# Patient Record
Sex: Female | Born: 1975 | Race: White | Hispanic: Yes | Marital: Married | Smoking: Never smoker
Health system: Southern US, Community
[De-identification: ages and names within clinical notes are randomized; demographics above are authoritative.]

## PROBLEM LIST (undated history)

## (undated) DIAGNOSIS — E785 Hyperlipidemia, unspecified: Secondary | ICD-10-CM

## (undated) DIAGNOSIS — R011 Cardiac murmur, unspecified: Secondary | ICD-10-CM

## (undated) DIAGNOSIS — D649 Anemia, unspecified: Secondary | ICD-10-CM

## (undated) HISTORY — DX: Hyperlipidemia, unspecified: E78.5

## (undated) HISTORY — DX: Cardiac murmur, unspecified: R01.1

## (undated) HISTORY — DX: Anemia, unspecified: D64.9

---

## 2006-07-06 ENCOUNTER — Encounter (INDEPENDENT_AMBULATORY_CARE_PROVIDER_SITE_OTHER): Payer: Self-pay | Admitting: *Deleted

## 2006-07-06 ENCOUNTER — Ambulatory Visit: Payer: Self-pay | Admitting: Family Medicine

## 2006-07-07 LAB — CONVERTED CEMR LAB
Basophils Absolute: 0 10*3/uL (ref 0.0–0.1)
Basophils Relative: 0 % (ref 0–1)
Eosinophils Absolute: 0.1 10*3/uL (ref 0.0–0.7)
MCHC: 32 g/dL (ref 30.0–36.0)
MCV: 94.1 fL (ref 78.0–100.0)
Neutro Abs: 8.8 10*3/uL — ABNORMAL HIGH (ref 1.7–7.7)
Neutrophils Relative %: 71 % (ref 43–77)
RDW: 14 % (ref 11.5–14.0)
Rubella: 69 intl units/mL — ABNORMAL HIGH

## 2006-07-13 ENCOUNTER — Ambulatory Visit: Payer: Self-pay | Admitting: Family Medicine

## 2006-07-13 ENCOUNTER — Encounter (INDEPENDENT_AMBULATORY_CARE_PROVIDER_SITE_OTHER): Payer: Self-pay | Admitting: *Deleted

## 2006-07-14 ENCOUNTER — Ambulatory Visit: Payer: Self-pay | Admitting: Sports Medicine

## 2006-07-16 LAB — CONVERTED CEMR LAB: Pap Smear: NORMAL

## 2006-07-17 ENCOUNTER — Ambulatory Visit: Payer: Self-pay | Admitting: Sports Medicine

## 2006-07-31 LAB — CONVERTED CEMR LAB: GC Probe Amp, Genital: NEGATIVE

## 2006-08-16 ENCOUNTER — Ambulatory Visit: Payer: Self-pay | Admitting: Family Medicine

## 2006-08-17 ENCOUNTER — Ambulatory Visit: Payer: Self-pay | Admitting: Family Medicine

## 2006-08-17 ENCOUNTER — Encounter (INDEPENDENT_AMBULATORY_CARE_PROVIDER_SITE_OTHER): Payer: Self-pay | Admitting: *Deleted

## 2006-09-06 ENCOUNTER — Ambulatory Visit (HOSPITAL_COMMUNITY): Admission: RE | Admit: 2006-09-06 | Discharge: 2006-09-06 | Payer: Self-pay | Admitting: Sports Medicine

## 2006-09-15 ENCOUNTER — Ambulatory Visit: Payer: Self-pay | Admitting: Family Medicine

## 2006-10-02 ENCOUNTER — Encounter (INDEPENDENT_AMBULATORY_CARE_PROVIDER_SITE_OTHER): Payer: Self-pay | Admitting: *Deleted

## 2006-10-11 ENCOUNTER — Encounter (INDEPENDENT_AMBULATORY_CARE_PROVIDER_SITE_OTHER): Payer: Self-pay | Admitting: *Deleted

## 2006-10-17 ENCOUNTER — Ambulatory Visit: Payer: Self-pay | Admitting: Family Medicine

## 2006-10-17 LAB — CONVERTED CEMR LAB: GTT, 1 hr: 185 mg/dL

## 2006-10-18 ENCOUNTER — Encounter (INDEPENDENT_AMBULATORY_CARE_PROVIDER_SITE_OTHER): Payer: Self-pay | Admitting: *Deleted

## 2006-10-18 ENCOUNTER — Ambulatory Visit: Payer: Self-pay | Admitting: Family Medicine

## 2006-11-16 ENCOUNTER — Ambulatory Visit: Payer: Self-pay | Admitting: Family Medicine

## 2006-11-16 ENCOUNTER — Encounter (INDEPENDENT_AMBULATORY_CARE_PROVIDER_SITE_OTHER): Payer: Self-pay | Admitting: *Deleted

## 2006-11-16 LAB — CONVERTED CEMR LAB

## 2006-11-30 ENCOUNTER — Ambulatory Visit: Payer: Self-pay | Admitting: Family Medicine

## 2006-12-11 ENCOUNTER — Ambulatory Visit: Payer: Self-pay | Admitting: Family Medicine

## 2006-12-11 LAB — CONVERTED CEMR LAB: Whiff Test: NEGATIVE

## 2006-12-27 ENCOUNTER — Ambulatory Visit: Payer: Self-pay | Admitting: Family Medicine

## 2007-01-10 ENCOUNTER — Ambulatory Visit: Payer: Self-pay | Admitting: Family Medicine

## 2007-01-10 ENCOUNTER — Encounter: Payer: Self-pay | Admitting: Family Medicine

## 2007-01-10 LAB — CONVERTED CEMR LAB: GC Probe Amp, Genital: NEGATIVE

## 2007-01-11 ENCOUNTER — Encounter: Payer: Self-pay | Admitting: Family Medicine

## 2007-01-17 ENCOUNTER — Ambulatory Visit: Payer: Self-pay | Admitting: Family Medicine

## 2007-01-22 ENCOUNTER — Telehealth (INDEPENDENT_AMBULATORY_CARE_PROVIDER_SITE_OTHER): Payer: Self-pay | Admitting: Family Medicine

## 2007-01-22 ENCOUNTER — Ambulatory Visit: Payer: Self-pay | Admitting: Obstetrics & Gynecology

## 2007-01-24 ENCOUNTER — Ambulatory Visit: Payer: Self-pay | Admitting: Family Medicine

## 2007-01-30 ENCOUNTER — Ambulatory Visit: Payer: Self-pay | Admitting: Family Medicine

## 2007-02-05 ENCOUNTER — Ambulatory Visit: Payer: Self-pay | Admitting: Family Medicine

## 2007-02-07 ENCOUNTER — Ambulatory Visit: Payer: Self-pay | Admitting: *Deleted

## 2007-02-07 ENCOUNTER — Inpatient Hospital Stay (HOSPITAL_COMMUNITY): Admission: AD | Admit: 2007-02-07 | Discharge: 2007-02-07 | Payer: Self-pay | Admitting: Obstetrics and Gynecology

## 2007-02-10 ENCOUNTER — Ambulatory Visit: Payer: Self-pay | Admitting: Obstetrics and Gynecology

## 2007-02-10 ENCOUNTER — Inpatient Hospital Stay (HOSPITAL_COMMUNITY): Admission: AD | Admit: 2007-02-10 | Discharge: 2007-02-14 | Payer: Self-pay | Admitting: Gynecology

## 2007-03-15 ENCOUNTER — Ambulatory Visit: Payer: Self-pay | Admitting: Family Medicine

## 2007-03-15 ENCOUNTER — Encounter: Payer: Self-pay | Admitting: Family Medicine

## 2007-06-05 ENCOUNTER — Ambulatory Visit: Payer: Self-pay | Admitting: Family Medicine

## 2007-06-20 ENCOUNTER — Ambulatory Visit: Payer: Self-pay | Admitting: Family Medicine

## 2007-08-18 ENCOUNTER — Encounter: Payer: Self-pay | Admitting: *Deleted

## 2007-09-20 IMAGING — US US OB COMP +14 WK
1 series · 14 of 28 positions shown · non-contrast
Comparison: none

OBSTETRICAL ULTRASOUND:

 This ultrasound exam was performed in the [HOSPITAL] Ultrasound Department.  The OB US report was generated in the AS system, and faxed to the ordering physician.  This report is also available in [REDACTED] PACS.

[Series 1: us ob comp +14 wk · 0.28mm/px · 14 of 69 slices shown]
[im 3/69]
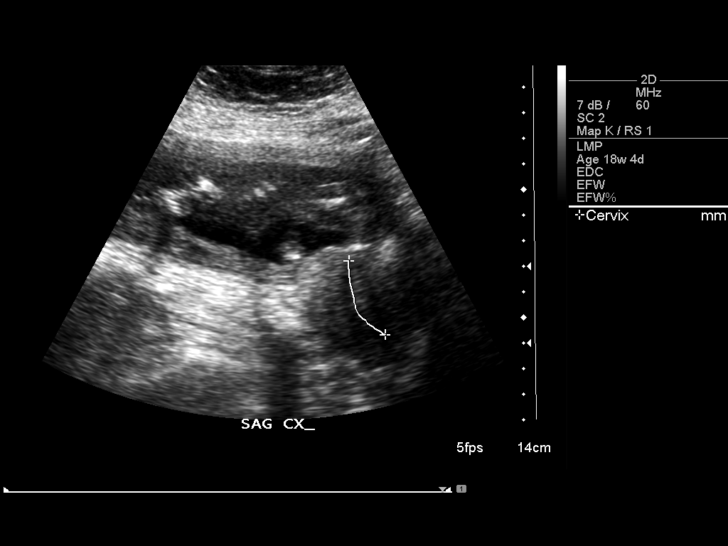
[im 8/69]
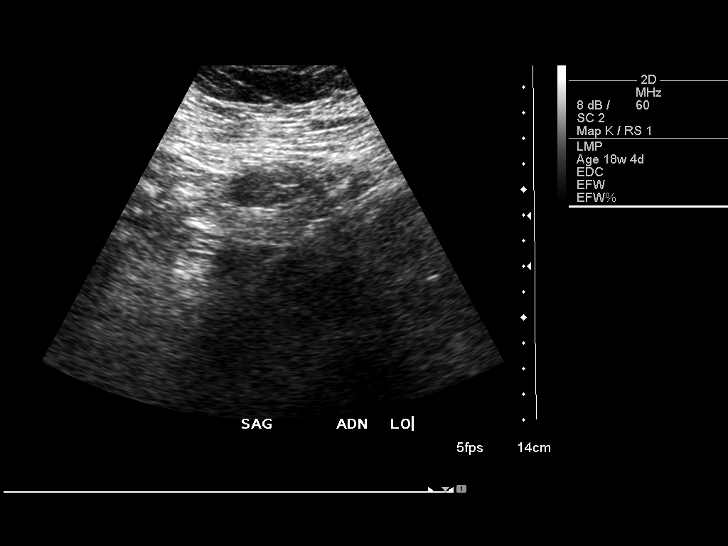
[im 13/69]
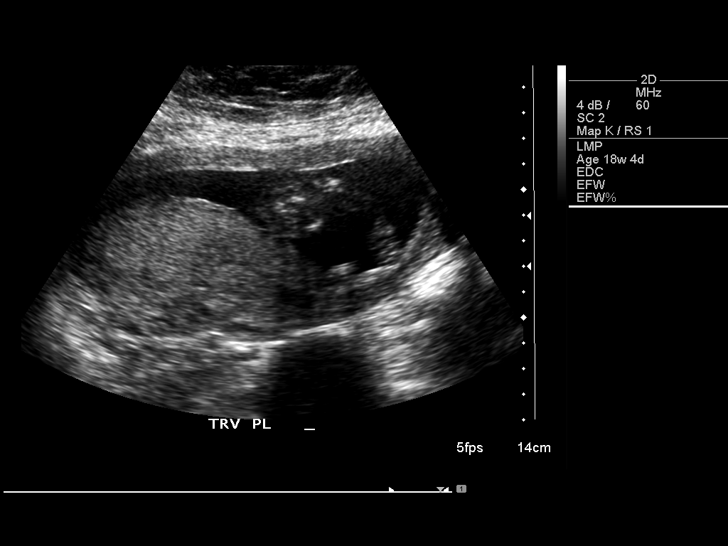
[im 18/69]
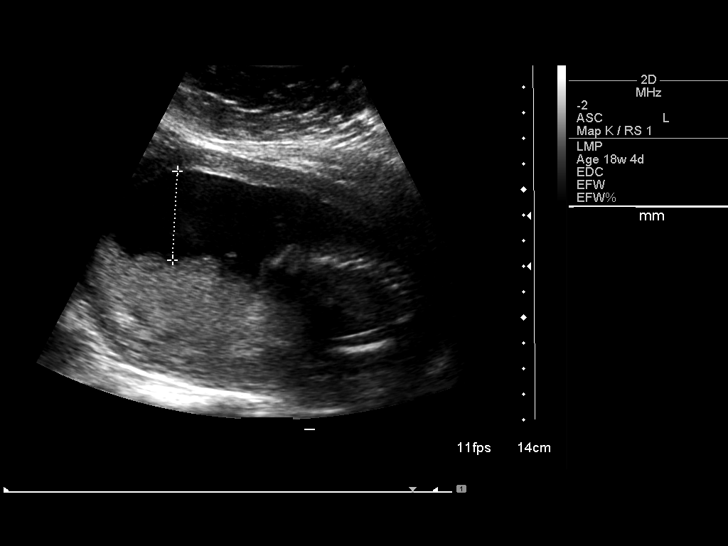
[im 23/69]
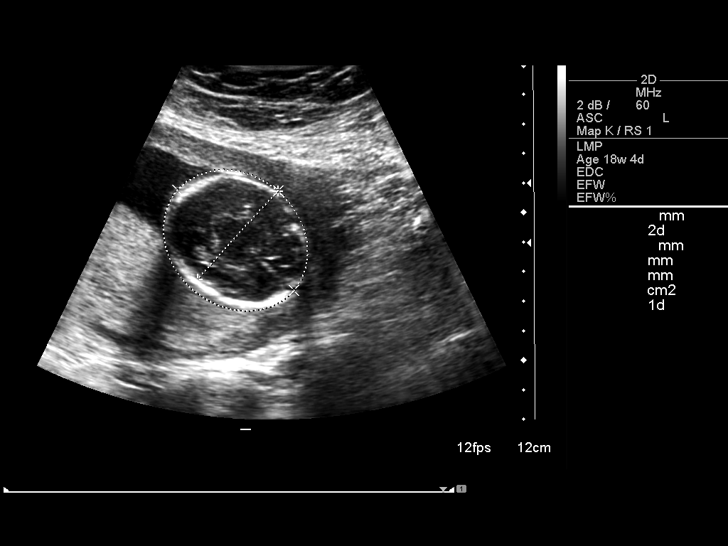
[im 28/69]
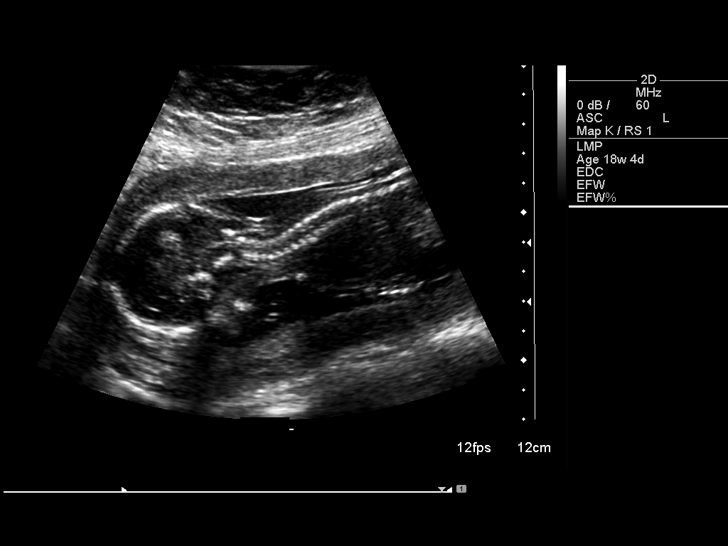
[im 33/69]
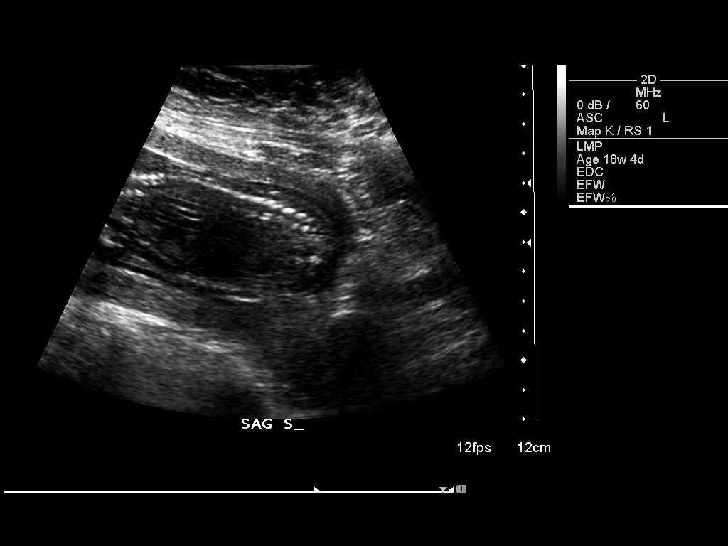
[im 38/69]
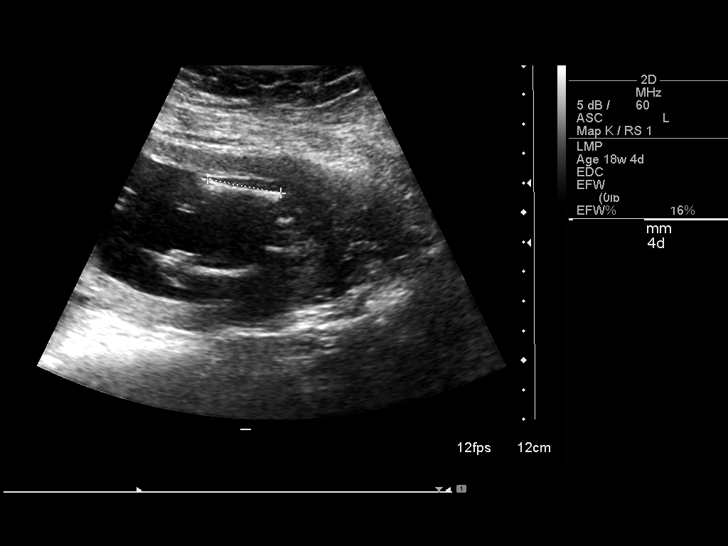
[im 43/69]
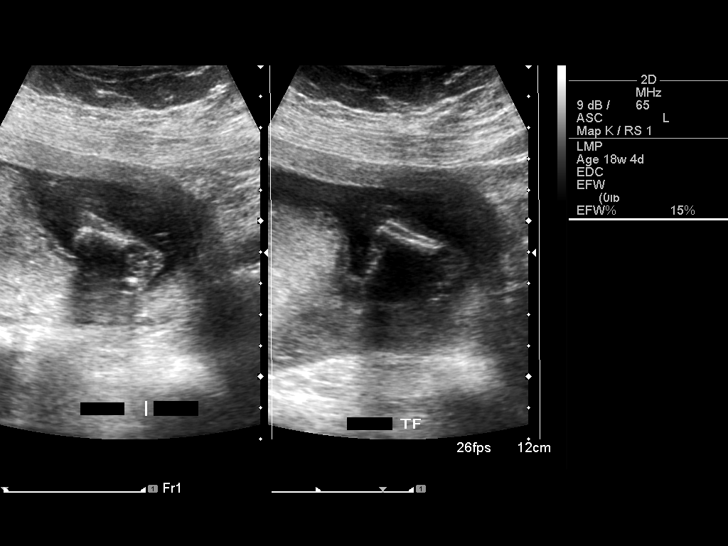
[im 48/69]
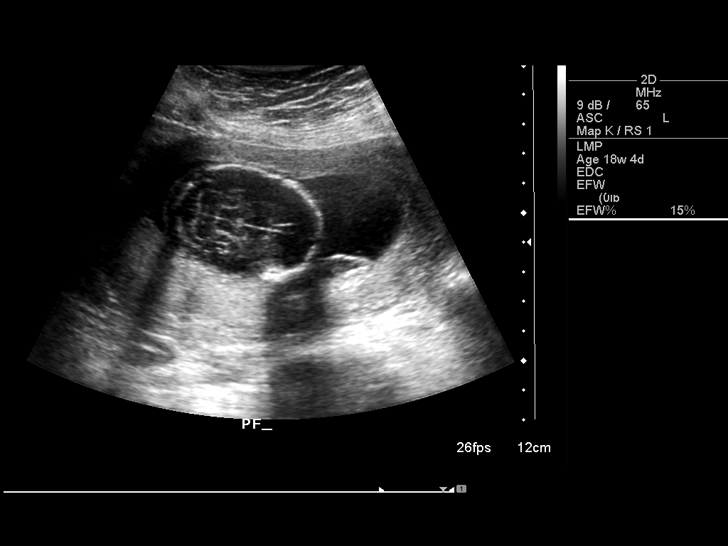
[im 53/69]
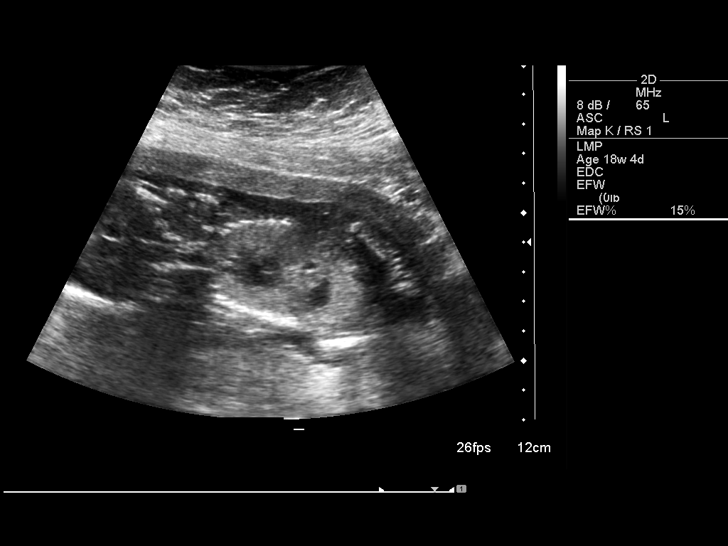
[im 58/69]
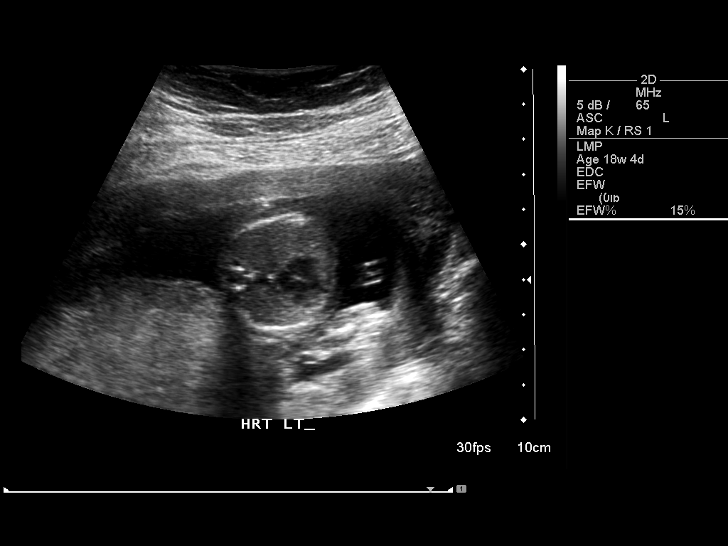
[im 63/69]
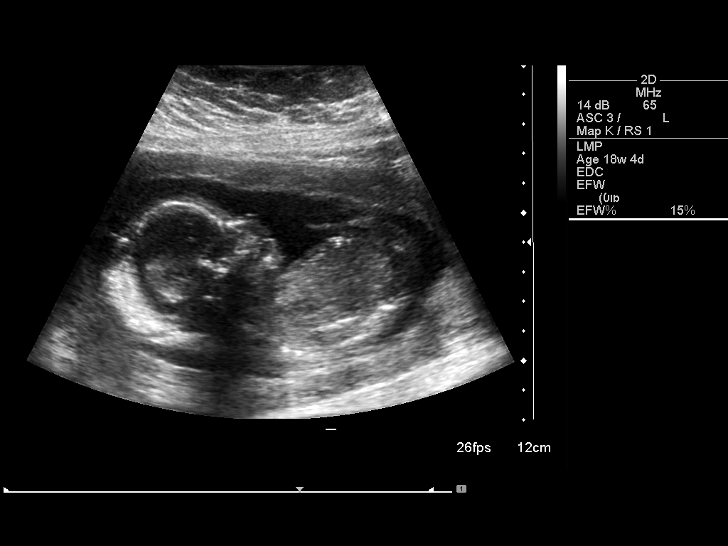
[im 69/69]
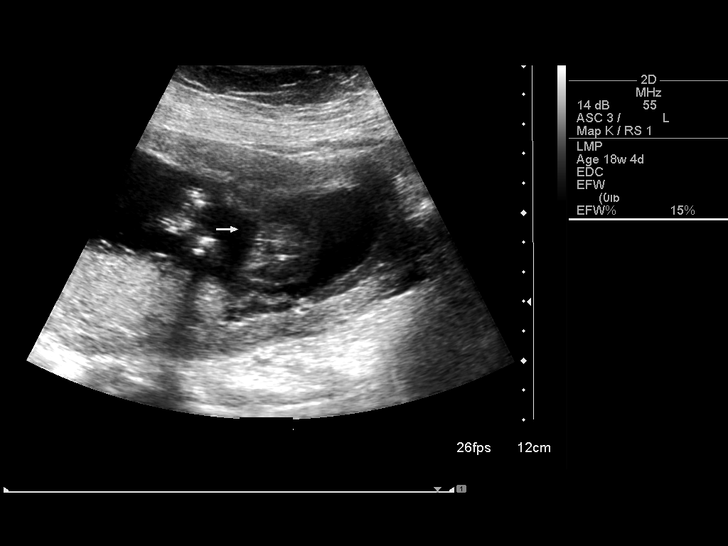

[14 of 28 positions shown; findings below may reference images not displayed]

IMPRESSION: See AS Obstetric US report.

## 2007-10-25 ENCOUNTER — Ambulatory Visit: Payer: Self-pay | Admitting: Family Medicine

## 2007-10-25 ENCOUNTER — Encounter: Payer: Self-pay | Admitting: Family Medicine

## 2007-10-25 DIAGNOSIS — R300 Dysuria: Secondary | ICD-10-CM

## 2007-10-25 LAB — CONVERTED CEMR LAB
Bilirubin Urine: NEGATIVE
GC Probe Amp, Genital: NEGATIVE
Glucose, Urine, Semiquant: NEGATIVE
Ketones, urine, test strip: NEGATIVE
Specific Gravity, Urine: 1.02
Urobilinogen, UA: 0.2
Whiff Test: NEGATIVE
pH: 7

## 2007-11-14 ENCOUNTER — Ambulatory Visit: Payer: Self-pay | Admitting: Family Medicine

## 2007-12-04 ENCOUNTER — Ambulatory Visit: Payer: Self-pay | Admitting: Family Medicine

## 2007-12-04 DIAGNOSIS — G44229 Chronic tension-type headache, not intractable: Secondary | ICD-10-CM

## 2008-02-18 ENCOUNTER — Ambulatory Visit: Payer: Self-pay | Admitting: Family Medicine

## 2008-02-18 DIAGNOSIS — J1089 Influenza due to other identified influenza virus with other manifestations: Secondary | ICD-10-CM

## 2008-03-04 ENCOUNTER — Encounter (INDEPENDENT_AMBULATORY_CARE_PROVIDER_SITE_OTHER): Payer: Self-pay | Admitting: Family Medicine

## 2008-03-04 ENCOUNTER — Ambulatory Visit: Payer: Self-pay | Admitting: Family Medicine

## 2008-03-04 DIAGNOSIS — B373 Candidiasis of vulva and vagina: Secondary | ICD-10-CM

## 2008-03-04 DIAGNOSIS — R109 Unspecified abdominal pain: Secondary | ICD-10-CM

## 2008-03-04 LAB — CONVERTED CEMR LAB
Bilirubin Urine: NEGATIVE
Glucose, Urine, Semiquant: NEGATIVE
Ketones, urine, test strip: NEGATIVE
Specific Gravity, Urine: 1.02
Whiff Test: NEGATIVE

## 2008-03-26 ENCOUNTER — Ambulatory Visit: Payer: Self-pay | Admitting: Family Medicine

## 2008-05-26 ENCOUNTER — Ambulatory Visit: Payer: Self-pay | Admitting: Family Medicine

## 2008-05-26 DIAGNOSIS — N898 Other specified noninflammatory disorders of vagina: Secondary | ICD-10-CM | POA: Insufficient documentation

## 2008-05-26 LAB — CONVERTED CEMR LAB

## 2008-06-16 ENCOUNTER — Ambulatory Visit: Payer: Self-pay | Admitting: Family Medicine

## 2008-07-22 ENCOUNTER — Ambulatory Visit: Payer: Self-pay | Admitting: Family Medicine

## 2008-07-22 DIAGNOSIS — M7700 Medial epicondylitis, unspecified elbow: Secondary | ICD-10-CM

## 2008-11-20 ENCOUNTER — Encounter: Payer: Self-pay | Admitting: Family Medicine

## 2008-11-20 ENCOUNTER — Ambulatory Visit: Payer: Self-pay | Admitting: Family Medicine

## 2008-11-20 LAB — CONVERTED CEMR LAB
Chlamydia, DNA Probe: NEGATIVE
GC Probe Amp, Genital: NEGATIVE
Whiff Test: POSITIVE

## 2008-11-24 ENCOUNTER — Ambulatory Visit: Payer: Self-pay | Admitting: Family Medicine

## 2009-01-20 ENCOUNTER — Ambulatory Visit: Payer: Self-pay | Admitting: Family Medicine

## 2009-01-20 ENCOUNTER — Encounter: Payer: Self-pay | Admitting: Family Medicine

## 2009-01-20 DIAGNOSIS — R635 Abnormal weight gain: Secondary | ICD-10-CM

## 2009-01-20 DIAGNOSIS — R632 Polyphagia: Secondary | ICD-10-CM | POA: Insufficient documentation

## 2009-01-20 DIAGNOSIS — R358 Other polyuria: Secondary | ICD-10-CM

## 2009-01-20 DIAGNOSIS — R6889 Other general symptoms and signs: Secondary | ICD-10-CM

## 2009-01-20 LAB — CONVERTED CEMR LAB
ALT: 12 units/L (ref 0–35)
Albumin: 4.8 g/dL (ref 3.5–5.2)
Alkaline Phosphatase: 78 units/L (ref 39–117)
Ketones, urine, test strip: NEGATIVE
Nitrite: NEGATIVE
Potassium: 3.8 meq/L (ref 3.5–5.3)
Protein, U semiquant: NEGATIVE
Sodium: 142 meq/L (ref 135–145)
Total Bilirubin: 0.4 mg/dL (ref 0.3–1.2)
Total Protein: 7.3 g/dL (ref 6.0–8.3)
Urobilinogen, UA: 0.2

## 2009-01-23 ENCOUNTER — Ambulatory Visit: Payer: Self-pay | Admitting: Family Medicine

## 2009-01-23 DIAGNOSIS — N390 Urinary tract infection, site not specified: Secondary | ICD-10-CM

## 2010-03-19 ENCOUNTER — Encounter: Payer: Self-pay | Admitting: *Deleted

## 2010-04-10 ENCOUNTER — Ambulatory Visit (HOSPITAL_COMMUNITY)
Admission: RE | Admit: 2010-04-10 | Discharge: 2010-04-10 | Disposition: A | Discharge status: Home / Self Care | Payer: Self-pay | Source: Ambulatory Visit | Attending: Emergency Medicine | Admitting: Emergency Medicine

## 2010-04-10 ENCOUNTER — Other Ambulatory Visit: Payer: Self-pay | Admitting: Emergency Medicine

## 2010-04-10 DIAGNOSIS — R110 Nausea: Secondary | ICD-10-CM | POA: Insufficient documentation

## 2010-04-10 DIAGNOSIS — R51 Headache: Secondary | ICD-10-CM | POA: Insufficient documentation

## 2010-06-22 NOTE — Discharge Summary (Signed)
[name], [name]               ACCOUNT NO.:  [id number]   MEDICAL RECORD NO.:  [id number]          PATIENT TYPE:  INP   LOCATION:  9106                          FACILITY:  [facility]   PHYSICIAN:  Nancy Carne, MD  DATE OF BIRTH:  1975/02/20   DATE OF ADMISSION:  02/10/2007  DATE OF DISCHARGE:  02/14/2007                               DISCHARGE SUMMARY   DISCHARGE DIAGNOSES:  1. Delivery of a viable female infant at 16 weeks and 1 day      gestational age.   1. Repeat low transverse cesarean section for cephalopelvic      disproportion after failed trial of labor after cesarean section.   DISCHARGE MEDICATIONS:  1. Percocet 5/325 1-2 tablets every 4-6 hours as needed for pain.  2. Motrin 600 mg 1 tablet every 6 hours as needed for moderate pain.  3. Colace 100 mg 1 tablet twice daily as needed for constipation.   PERTINENT LABS:  Blood type is O+, GBS negative, HIV negative, rubella  immune, RPR nonreactive.  Discharge hemoglobin is 10.8 on post operative  day #1.   FOLLOW-UP INSTRUCTIONS:  The patient will follow up at [facility] family  practice center for her 6-week postpartum check.  The patient was  instructed that the clinic will call her with this appointment date and  time   HOSPITAL COURSE:  This is a 35 year old, gravida 3, para 2-0-1-2 who was  admitted to [facility] for an induction of labor. The indication  was post dates at [redacted] weeks gestational age.  Her cervical exam was  completed by myself and Dr. Mia Creek and it was decided to induce labor  initially with a Foley bulb in place. Once her Foley bulb was displaced,  she was started on Pitocin for augmentation.  She had adequate  contractions and dilated her cervix to approximately 8 cm  at which  point her cervix began to swell and she never did become 100% effaced.  Throughout her augmentation, the head of the fetus remained high within  the pelvis and distended very little through the augmentation.   After  consultation with Dr. Emelda Fear, a non emergent cesarean section was  performed, the indication being cephalopelvic disproportion.  Please see  the operative note for full details of this procedure. The cesarean  section and postoperative course were routine.  At the time of  discharge, she is currently breast feeding.  She has  opted to use condoms for birth control until her 6-week postpartum check  at which time she will discuss again.  Her pain is adequately controlled  with Motrin and Percocet and she will follow up at [facility] family  practice in 6 weeks.      Sylvan Cheese, M.D.  Electronically Signed      Nancy Carne, MD  Electronically Signed    MJ/MEDQ  D:  02/14/2007  T:  02/14/2007  Job:  [id number]

## 2010-06-22 NOTE — Op Note (Signed)
[name], [name]               ACCOUNT NO.:  [id number]   MEDICAL RECORD NO.:  [id number]          PATIENT TYPE:  INP   LOCATION:  9106                          FACILITY:  [facility]   PHYSICIAN:  Tilda Burrow, M.D. DATE OF BIRTH:  04-Feb-1976   DATE OF PROCEDURE:  02/11/2007  DATE OF DISCHARGE:                               OPERATIVE REPORT   PREOPERATIVE DIAGNOSIS:  Pregnancy at [redacted] weeks gestation, prior cesarean  section, requesting trial of labor, cephalopelvic disproportion.   POSTOPERATIVE DIAGNOSIS:  Pregnancy at [redacted] weeks gestation, prior  cesarean section, requesting trial of labor, cephalopelvic  disproportion, delivered.   PROCEDURE:  Repeat low transverse cesarean section.   SURGEON:  Tilda Burrow, M.D.   ASSISTANT:  Paticia Stack, M.D.   ANESTHESIA:  Epidural.   COMPLICATIONS:  None.   FINDINGS:  Well-developed lower uterine segment.  Varicosities in the  area of the left uterine vein requiring additional suturing for  hemostasis.   DESCRIPTION OF PROCEDURE:  The patient was taken to the operating room  after reaching 8 cm dilation and the cervix beginning to thicken.  Hematuria present.  A Pfannenstiel incision was achieved with excision  of the skin cicatrix.  A Pfannenstiel type incisional entry of the  abdominal cavity was performed with easy entry in the peritoneal cavity.  The bladder was identified and bladder flap developed a couple of cm  above the bladder, and retracted slightly inferiorly.  A transverse  uterine incision was made at the level of the fetal chin and the fetal  vertex rotated into the incision and delivered with fundal pressure.  The cord was clamped and the infant was passed to the awaiting  pediatrician.  Dr. Yetta Barre went over to assist with the baby's care.  The  cord blood samples were obtained and the uterus emptied by crude  massage.  Membranes were intact and the placenta was intact.  The uterus  was swabbed out and confirmed  as emptied with no visible membrane  remnants.  The uterus was grasped with Kelly clamps at three sites, and  a single layer running locking closure of the uterine incision  performed.  The right side was performed without difficulty.  The left  side identified that there was some diffuse venous oozing at the left  corner of the incision and with care ring forceps was placed over the  generous venous oozing from that area and two figure-of-eight sutures  placed in order to achieve adequate hemostasis.  The remainder of the  incision was closed in a standard running locking closure with good  hemostasis achieved.  The bladder flap was inspected.  A small oozing  vessel required brief point cautery, but otherwise was uneventful.  The  bladder flap was tacked loosely with a couple of interrupted sutures for  approximation.  The abdomen was inspected and small amount of bloody  fluid removed from the abdomen.  The fundus was firm.  The anterior  peritoneum was closed with 2-0 chromic.  The fascia was closed with  continuous running 0 Vicryl.  The subcu fatty tissues  reapproximated  with interrupted 2-0 plain x4 interrupted sutures and staple closure of  the skin completed the procedure.  Needle, sponge, and instrument counts  correct.  Estimated blood loss was 800 mL.   Postprocedure the patient had about the same amount of hematuria present  as before the procedure.  At no time during the case did we suspect  injury to the bladder.  It was noted that during the labor process the  Foley bulb had been beneath up until 2 o'clock at which time it was  dislodged and allowed to slip above the head.      Tilda Burrow, M.D.  Electronically Signed     JVF/MEDQ  D:  02/11/2007  T:  02/11/2007  Job:  [id number]

## 2010-10-28 LAB — CBC
HCT: 27.5 — ABNORMAL LOW
HCT: 39.4
Hemoglobin: 13.7
Hemoglobin: 9.6 — ABNORMAL LOW
MCV: 91
RBC: 3.01 — ABNORMAL LOW
RBC: 4.33
WBC: 13.8 — ABNORMAL HIGH
WBC: 18 — ABNORMAL HIGH

## 2010-10-28 LAB — URINALYSIS, DIPSTICK ONLY
Bilirubin Urine: NEGATIVE
Glucose, UA: NEGATIVE
Hgb urine dipstick: NEGATIVE
Specific Gravity, Urine: <1.005 — ABNORMAL LOW
pH: 6.5

## 2010-10-28 LAB — RPR: RPR Ser Ql: NONREACTIVE

## 2010-10-28 LAB — ABO/RH: ABO/RH(D): O POS

## 2010-11-12 LAB — URINE MICROSCOPIC-ADD ON

## 2010-11-12 LAB — URINALYSIS, ROUTINE W REFLEX MICROSCOPIC
Bilirubin Urine: NEGATIVE
Glucose, UA: NEGATIVE
Nitrite: NEGATIVE
Specific Gravity, Urine: 1.015
pH: 7

## 2011-04-24 IMAGING — CT CT HEAD W/O CM
1 series · 16 of 30 positions shown, 20 images · non-contrast
Comparison: None.

CLINICAL DATA: Headache and nausea

CT HEAD WITHOUT CONTRAST
TECHNIQUE: Contiguous axial images were obtained from the base of
the skull through the vertex without contrast

[Series 2: headseq 4.8 h45s · axial · 0.43mm/px · z∈[-84,+44]mm · 16 of 30 slices shown, 20 images]
[im 2/30  brain]
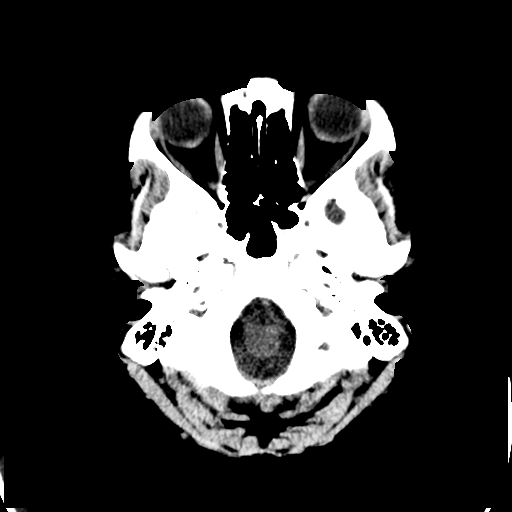
[im 2/30  bone]
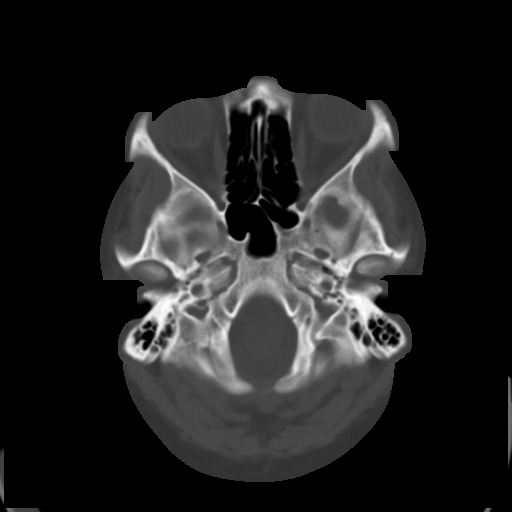
[im 4/30  brain]
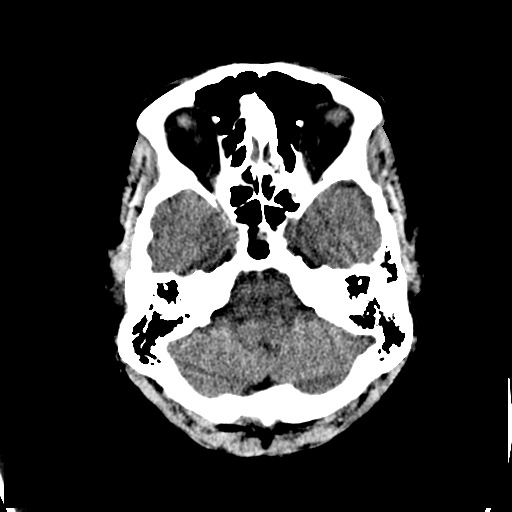
[im 6/30  brain]
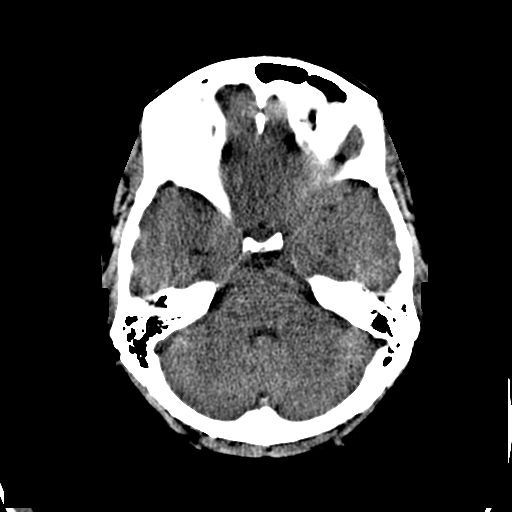
[im 8/30  brain]
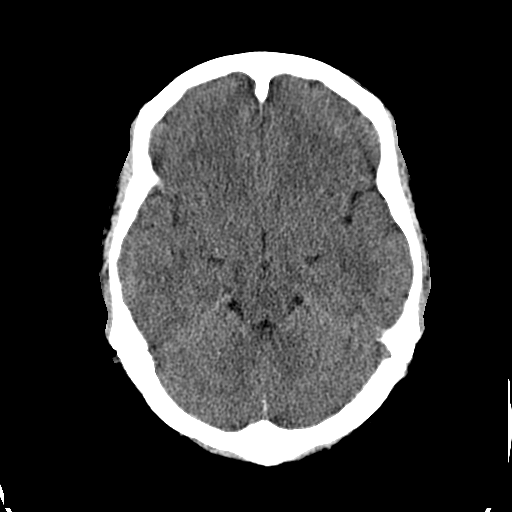
[im 9/30  brain]
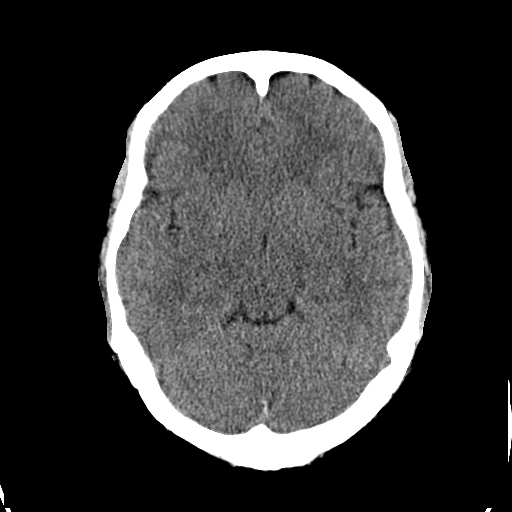
[im 9/30  bone]
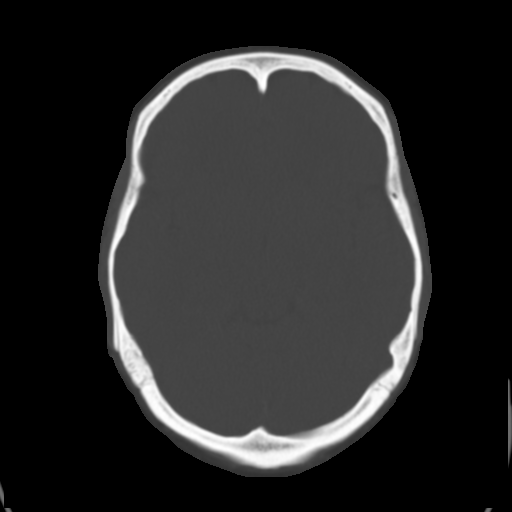
[im 11/30  brain]
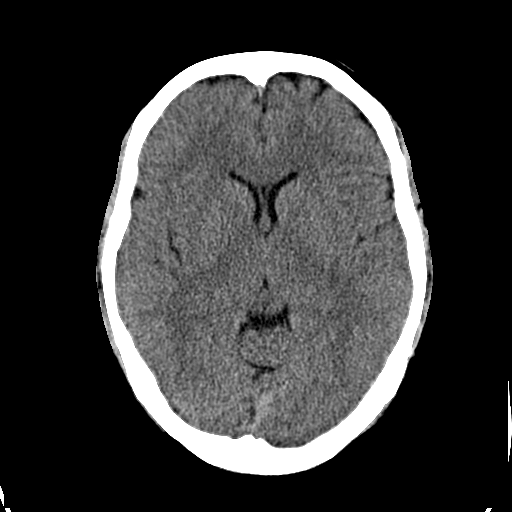
[im 13/30  brain]
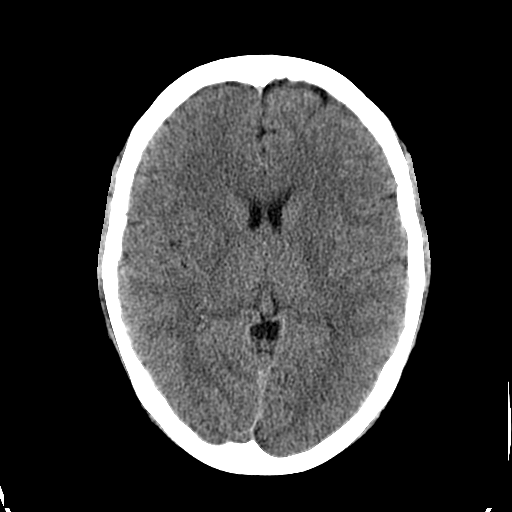
[im 15/30  brain]
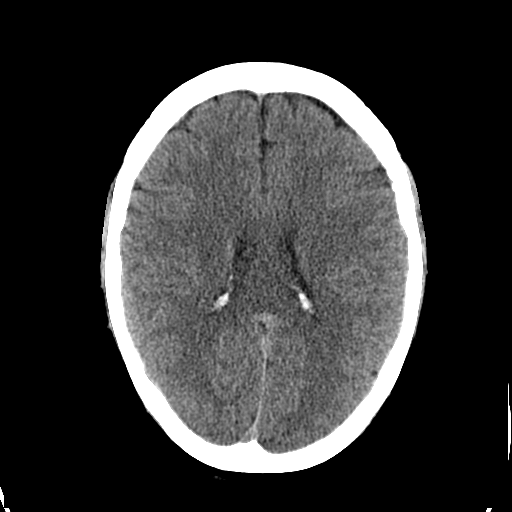
[im 16/30  brain]
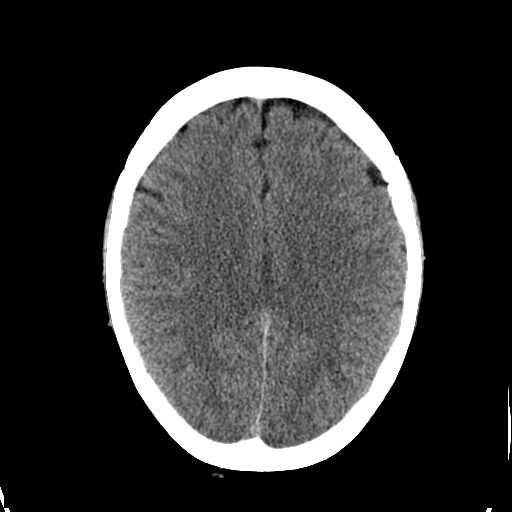
[im 16/30  bone]
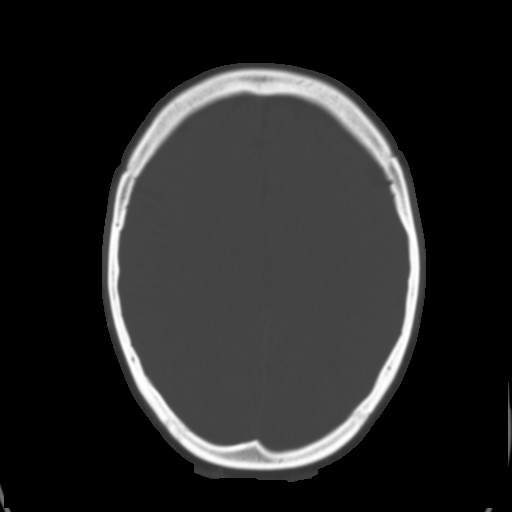
[im 18/30  brain]
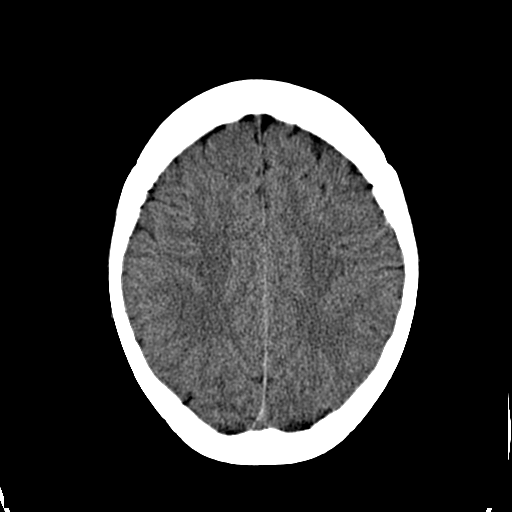
[im 20/30  brain]
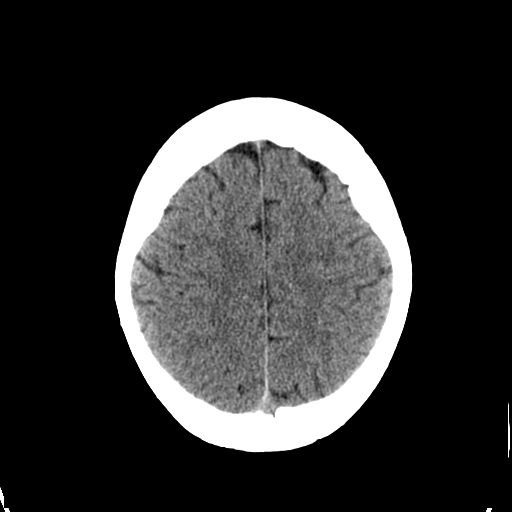
[im 22/30  brain]
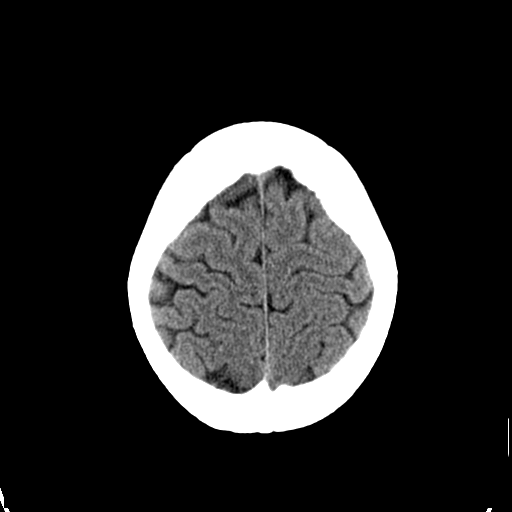
[im 23/30  brain]
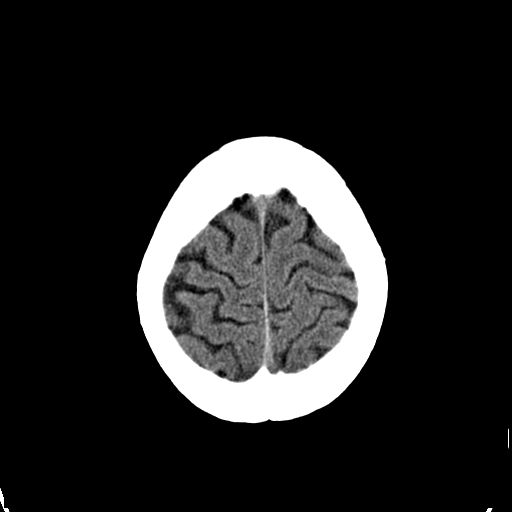
[im 23/30  bone]
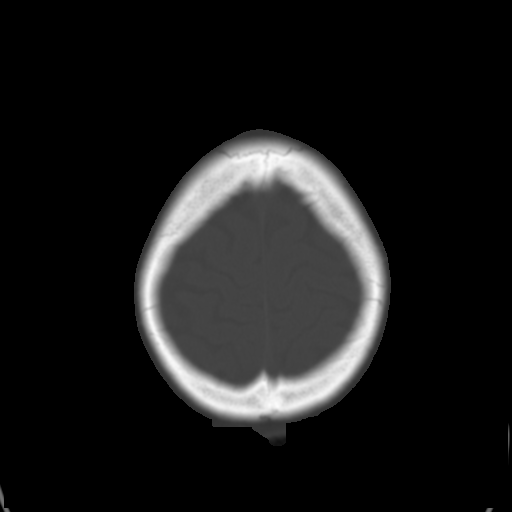
[im 25/30  brain]
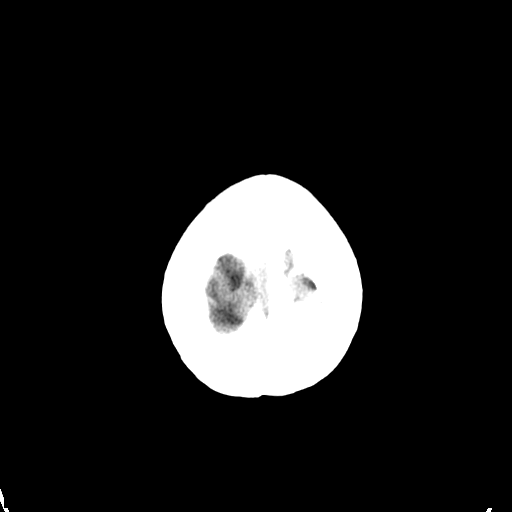
[im 27/30  brain]
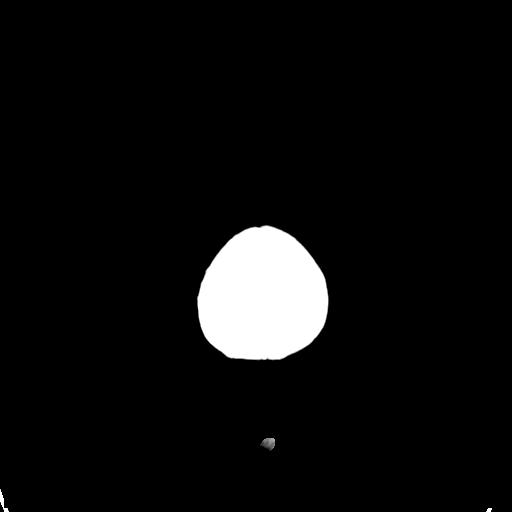
[im 29/30  brain]
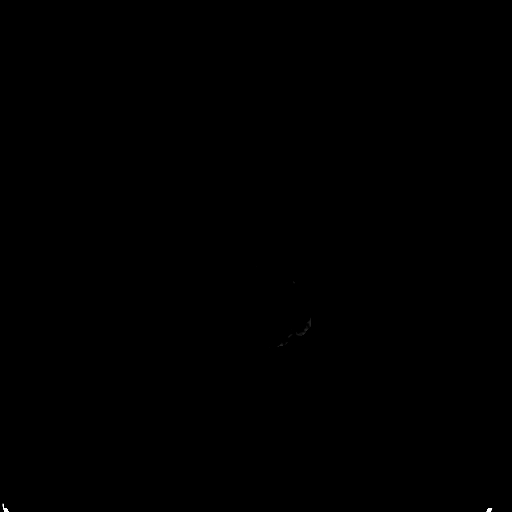

[16 of 30 positions shown; findings below may reference images not displayed]

FINDINGS: The brain has a normal appearance without evidence for
hemorrhage, acute infarction, hydrocephalus, or mass lesion.  There
is no extra axial fluid collection.  The skull and paranasal
sinuses are normal.
IMPRESSION: Normal CT of the head without contrast.

## 2011-05-30 ENCOUNTER — Other Ambulatory Visit: Payer: Self-pay | Admitting: Geriatric Medicine

## 2011-05-30 DIAGNOSIS — R102 Pelvic and perineal pain: Secondary | ICD-10-CM

## 2011-06-01 ENCOUNTER — Ambulatory Visit
Admission: RE | Admit: 2011-06-01 | Discharge: 2011-06-01 | Disposition: A | Discharge status: Home / Self Care | Payer: No Typology Code available for payment source | Source: Ambulatory Visit | Attending: Geriatric Medicine | Admitting: Geriatric Medicine

## 2011-06-01 DIAGNOSIS — R102 Pelvic and perineal pain: Secondary | ICD-10-CM

## 2012-01-27 ENCOUNTER — Ambulatory Visit: Payer: Self-pay | Admitting: Family Medicine

## 2012-01-27 VITALS — BP 114/69 | HR 59 | Temp 98.6°F | Resp 16 | Ht 60.38 in | Wt 142.6 lb

## 2012-01-27 DIAGNOSIS — N760 Acute vaginitis: Secondary | ICD-10-CM

## 2012-01-27 DIAGNOSIS — B9689 Other specified bacterial agents as the cause of diseases classified elsewhere: Secondary | ICD-10-CM

## 2012-01-27 DIAGNOSIS — Z0000 Encounter for general adult medical examination without abnormal findings: Secondary | ICD-10-CM

## 2012-01-27 LAB — POCT WET PREP WITH KOH
Clue Cells Wet Prep HPF POC: POSITIVE
RBC Wet Prep HPF POC: NEGATIVE

## 2012-01-27 MED ORDER — METRONIDAZOLE 500 MG PO TABS: 500.0000 mg | ORAL_TABLET | Freq: Two times a day (BID) | ORAL | Status: DC

## 2012-01-27 NOTE — Progress Notes (Signed)
  History this is a 36 year old Hispanic female who is here for annual physical examination. She has no major acute complaints except for vaginal itching and irritation.  Past medical history: Surgeries: None except tubal ligation Medical illnesses: None except for childbirth Allergies: None   Family history: Unremarkable Social history: Has 2 children she is at home with. Her husband has had sex with another woman so that causes some concern of STD risk.  Review of systems: HEENT unremarkable. Cardiovascular unremarkable. Respiratory unremarkable. GI unremarkable. GU unremarkable except for the above-noted problem.   Physical examination: HEENT normal. Throat clear. Neck supple without nodes thyromegaly. Chest clear. Heart regular without murmurs. And soft without mass tenderness. Breasts full without any masses. Mild fibrocystic nodularity. No axillary nodes. Pelvic normal external genitalia. No rashes. Head is unremarkable. Cervix benign. Bimanual exam feels no adnexal or uterine masses. Slight discharge with mild odor. Extremities normal. Skin unremarkable.  Assessment: Physical examination Bacterial vaginosis  Plan: Flagyl 500 twice a day for one week. If has still has a rash he needs to get checked himself.  Pap is pending.      Results for orders placed in visit on 01/27/12  POCT WET PREP WITH KOH      Component Value Range   Trichomonas, UA Negative     Clue Cells Wet Prep HPF POC positive     Epithelial Wet Prep HPF POC 3-5     Yeast Wet Prep HPF POC negative     Bacteria Wet Prep HPF POC moderate     RBC Wet Prep HPF POC negative     WBC Wet Prep HPF POC negative     KOH Prep POC Negative

## 2012-01-27 NOTE — Patient Instructions (Addendum)
Vaginosis bacteriana (Bacterial Vaginosis) La vaginosis bacteriana es una infeccin vaginal en la que el equilibrio normal de las bacterias de la vagina se modifica. Este equilibrio normal se ve afectado por un desarrollo excesivo de ciertas bacterias. Hay diferentes tipos de bacteria que causan la vaginosis bacteriana. Es el problema vaginal ms comn en las mujeres de edad frtil. CAUSAS  La causa de este trastorno no se conoce bien. Se produce como consecuencia de un aumento o desequilibrio de las bacterias nocivas.  Algunas actividades o conductas pueden poner en peligro el equilibrio normal de las bacterias en la vagina, y aumentar el riesgo. Entre ellas:  Tener un compaero sexual o mltiples compaeros sexuales.  Las duchas vaginales  Usar un dispositivo intrauterino (DIU) como mtodo anticonceptivo.  No se conoce el papel que juega la actividad sexual en el desarrollo de una VB. Sin embargo, las mujeres que nunca tuvieron relaciones sexuales raramente se infectan. El contagio no se produce en asientos de baos, camas, piscinas o por tocar objetos.  SNTOMAS  Flujo vaginal grisceo.  Olor parecido al pescado con la secrecin, en especial despus de tener relaciones sexuales.  Picazn o irritacin de la vagina y la vulva.  Ardor o dolor al orinar.  Algunas mujeres no presentan ningn sntoma. DIAGNSTICO El mdico realizar un examen vaginal para diagnosticar una vaginosis bacteriana. El mdico le indicar anlisis de laboratorio y observar las muestras del lquido vaginal en el microscopio. Buscar bacterias y clulas anormales (clulas clave), pH mayor a 4.5 y una prueba de aminas positivo, todos ellos asociados al BV.  RIESGOS Y COMPLICACIONES  Enfermedad plvica inflamatoria (EPI).  Infecciones luego de una ciruga ginecolgica.  VIH.  Virus del Herpes TRATAMIENTO En algunos casos, la infeccin desaparece sin tratamiento. Sin embargo, todas las mujeres con sntomas  de VB deben tratarse para evitar complicaciones, especialmente si se ha planificado una ciruga ginecolgica. Los compaeros varones generalmente no necesitan tratamiento. Sin embargo, puede contagiarse entre parejas femeninas, de modo que el tratamiento se realiza para evitar recurrencias.   La VB puede tratarse con medicamentos que destruyen grmenes (antibiticos). Estos se presentan en pldoras o en cremas vaginales. Tanto mujeres embarazadas como no embarazadas pueden usar ambos, pero se indican en dosis diferentes. Estos antibiticos no daan al beb.  La VB puede recurrir luego del tratamiento. Si esto ocurre, se prescribir un segundo tratamiento con antibiticos.  El tratamiento es importante en el caso de las mujeres embarazadas. Si no se trata, la VB puede causar parto prematuro, especialmente en una mujer que ha tenido un parto prematuro en el pasado. Todas las mujeres embarazadas que tienen sntomas de VB deben ser controladas y tratadas.  En los casos de recurrencia crnica, se prescribe un tratamiento con un gel vaginal dos veces por semana INSTRUCCIONES PARA EL CUIDADO DOMICILIARIO  Tome los medicamentos que le indic el mdico.  No mantenga relaciones sexuales hasta completar el tratamiento.  Comunique a sus compaeros sexuales que sufre una infeccin vaginal. Ellos deben concurrir para un control mdico si tienen problemas como una urticaria leve o picazn.  Practique el sexo seguro. Use preservativos. Tenga un solo compaero sexual. PREVENCIN Algunos pasos bsicos de prevencin pueden ayudar a reducir el riesgo de desequilibrio de las bacterias vaginales y de sufrir VB.  No mantener relaciones sexuales (abstinencia)  No utilice duchas vaginales.  Utilice todos los medicamentos que le han prescripto para el tratamiento, aunque los sntomas hayan desaparecido.  Comunique a su compaero sexual que sufre una VB. De ese modo   podr tratase, si es necesario, y podr evitar una  recurrencia. SOLICITE ATENCIN MDICA SI:  Los sntomas no mejoran luego de 3 das de tratamiento.  Aumentan la secrecin, el dolor o la fiebre. ASEGRESE QUE:   Comprende estas instrucciones.  Controlar su enfermedad.  Solicitar ayuda de inmediato si no mejora o empeora. PARA MS INFORMACIN: Division de STD Prevention (DSTDP), Centers for Disease Control and Prevention (Centros para el control y la prevencin de enfermedades, CDC): www.cdc.gov/std American Social Health Association (ASHA): www.ashastd.org  Document Released: 05/03/2007 Document Revised: 04/18/2011 Exit[employer] Patient Information 2013 [employer].  

## 2012-01-28 LAB — RPR

## 2012-01-28 LAB — HIV ANTIBODY (ROUTINE TESTING W REFLEX): HIV: NONREACTIVE

## 2012-01-31 LAB — PAP IG, CT-NG, RFX HPV ASCU: Chlamydia Probe Amp: NEGATIVE

## 2012-06-14 IMAGING — US US PELVIS COMPLETE
1 series · 14 of 25 positions shown · non-contrast
Comparison: None.

CLINICAL DATA: Left pelvic pain. LMP 05/12/2011.

TRANSABDOMINAL AND TRANSVAGINAL ULTRASOUND OF PELVIS
TECHNIQUE: Both transabdominal and transvaginal ultrasound
examinations of the pelvis were performed..  Transabdominal
technique was performed for global imaging of the pelvis including
uterus, ovaries, adnexal regions, and pelvic cul-de-sac.
It was necessary to proceed with endovaginal exam following the
transabdominal exam to visualize the endometrium and right ovary.

[Series 1: us pelvis complete · 0.30mm/px · 14 of 47 slices shown]
[im 1/47]
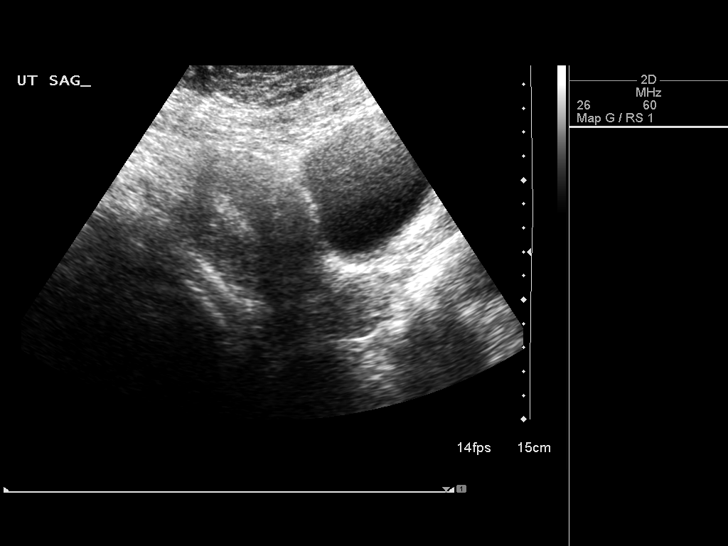
[im 4/47]
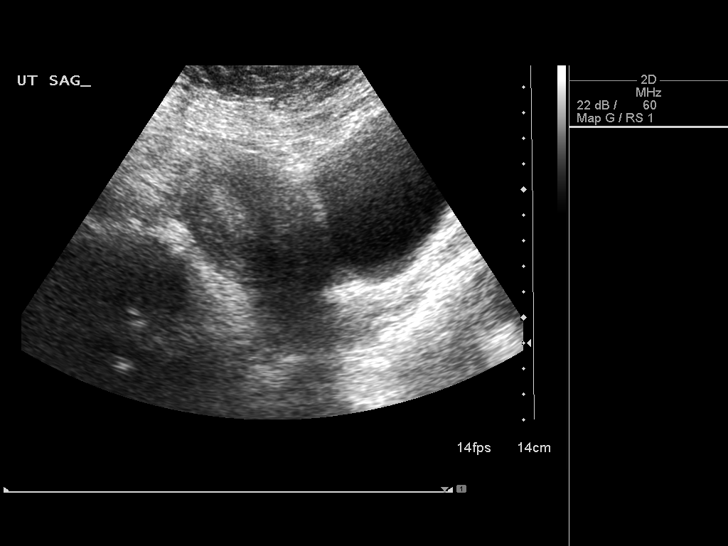
[im 8/47]
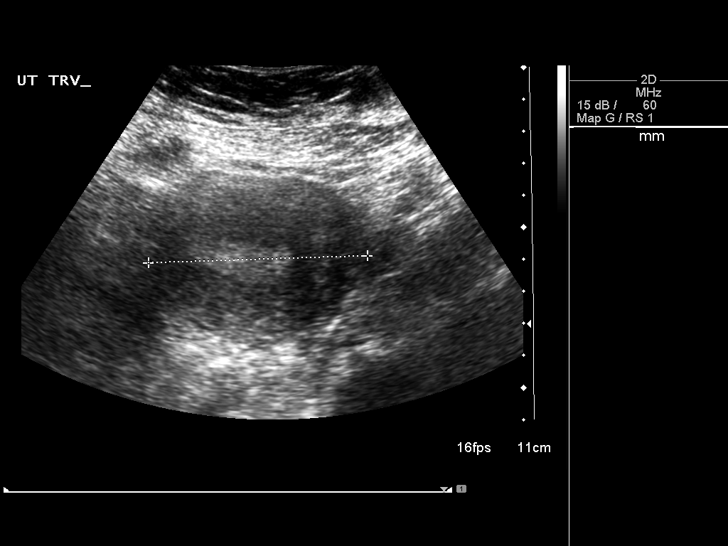
[im 12/47]
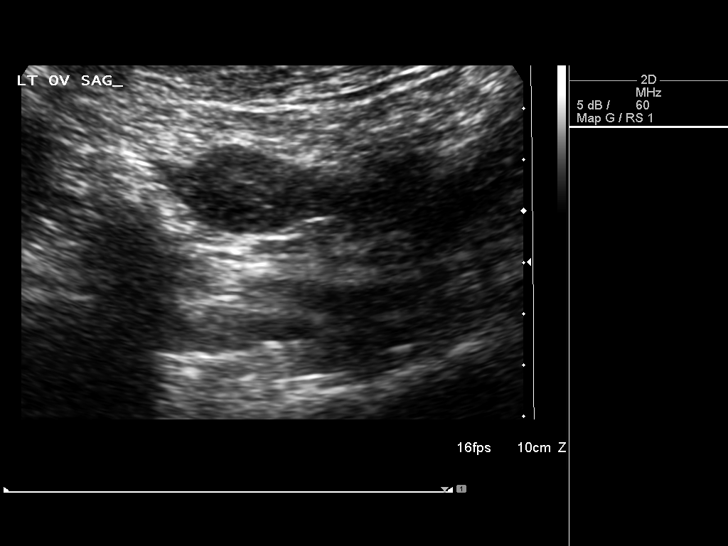
[im 16/47]
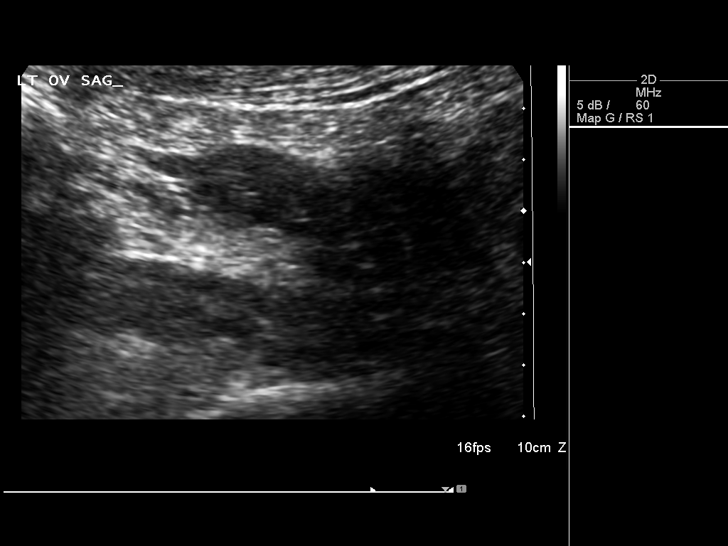
[im 18/47]
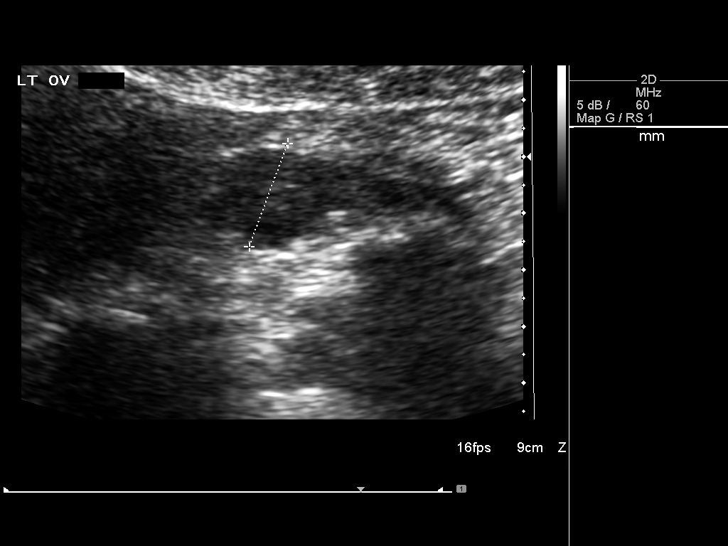
[im 22/47]
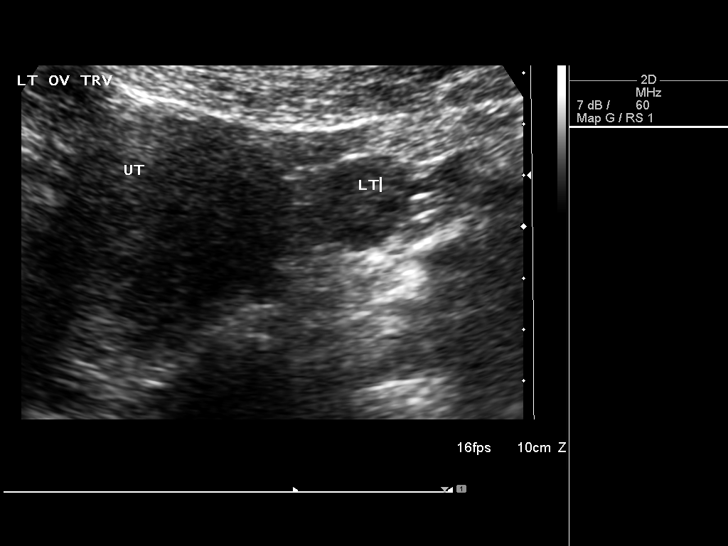
[im 25/47]
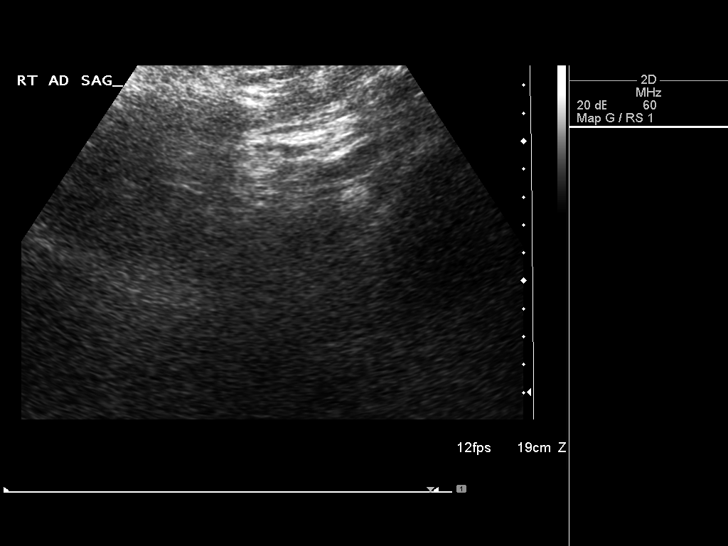
[im 29/47]
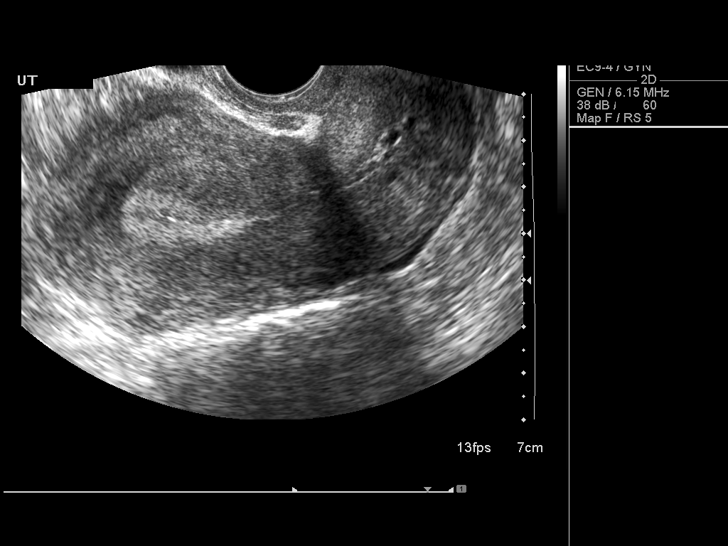
[im 31/47]
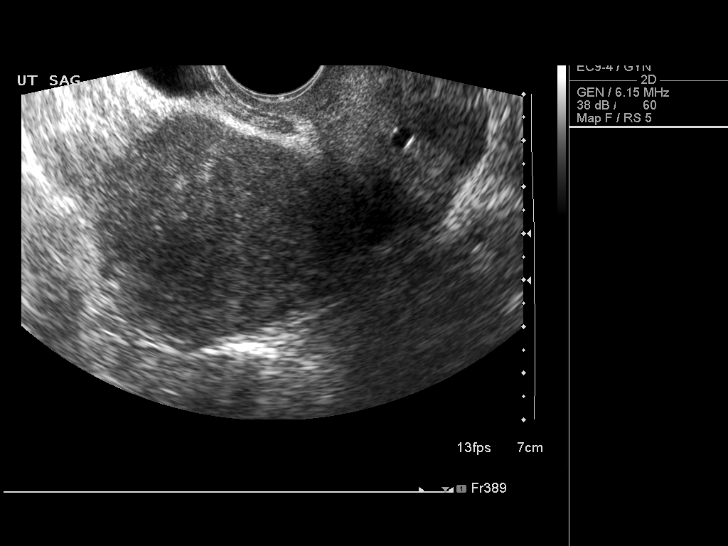
[im 35/47]
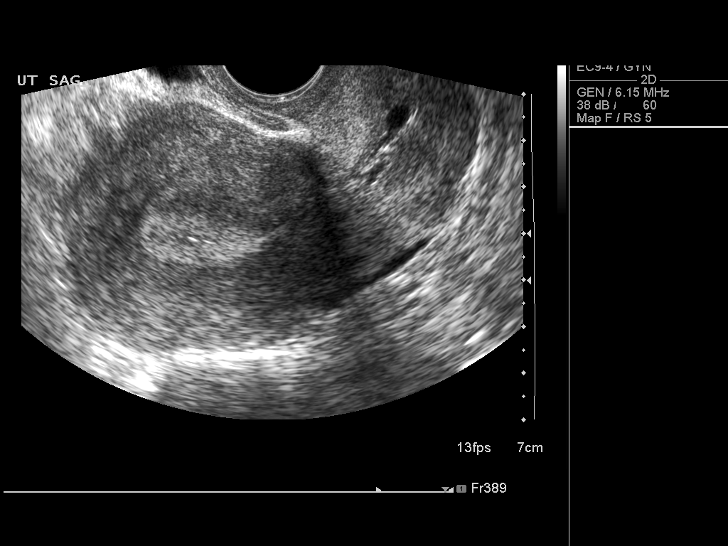
[im 39/47]
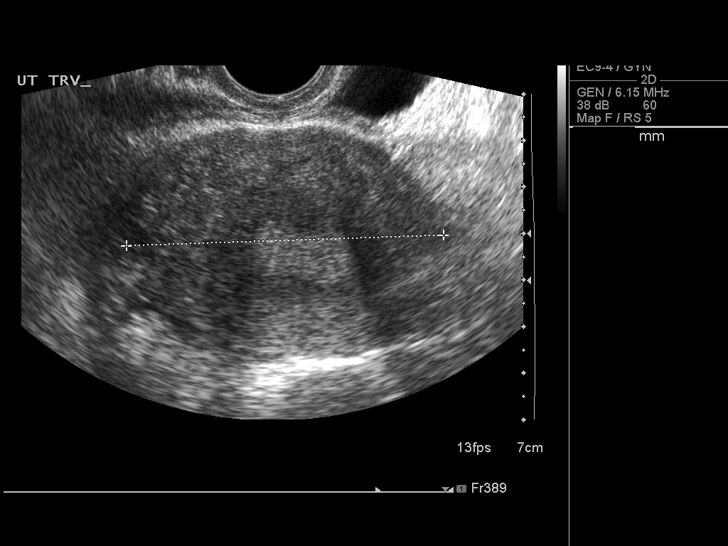
[im 43/47]
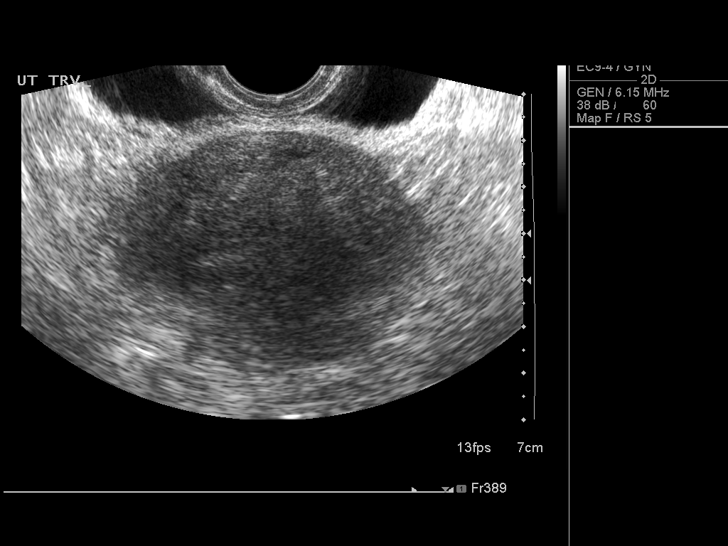
[im 47/47]
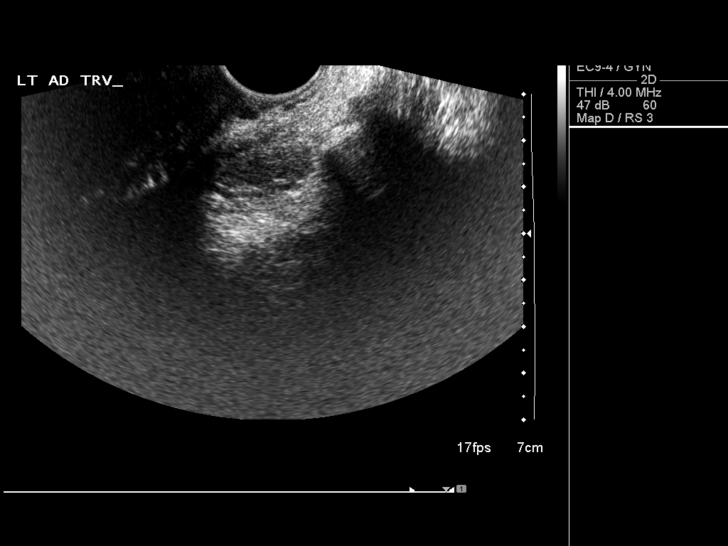

[14 of 25 positions shown; findings below may reference images not displayed]

FINDINGS: Uterus:  10.1 x 5.0 x 6.8 cm.  No fibroids identified.

Endometrium: Double layer thickness measures 14 mm transvaginally.
Normal appearance.

Right ovary: not directly visualized by transabdominal or
transvaginal sonography, however no adnexal mass identified.

Left ovary: 3.8 x 1.8 x 1.9 cm.  Normal appearance.

Other Findings:  No free fluid
IMPRESSION: 1.  Normal appearance of uterus and left ovary.
2.  Nonvisualization of right ovary, however no adnexal mass
identified.

## 2013-12-06 ENCOUNTER — Encounter: Payer: Self-pay | Admitting: *Deleted

## 2013-12-10 ENCOUNTER — Encounter: Payer: Self-pay | Admitting: Cardiology

## 2013-12-10 ENCOUNTER — Other Ambulatory Visit: Payer: Self-pay

## 2013-12-10 ENCOUNTER — Ambulatory Visit (INDEPENDENT_AMBULATORY_CARE_PROVIDER_SITE_OTHER): Payer: Self-pay | Admitting: Cardiology

## 2013-12-10 ENCOUNTER — Encounter: Payer: Self-pay | Admitting: *Deleted

## 2013-12-10 VITALS — BP 118/70 | HR 57 | Ht 60.0 in | Wt 167.0 lb

## 2013-12-10 DIAGNOSIS — R011 Cardiac murmur, unspecified: Secondary | ICD-10-CM

## 2013-12-10 NOTE — Progress Notes (Signed)
   HPI The  This for evaluation of her murmur. She has no past cardiac history. She was noted for the first time to have a murmur. She says that she is active. She can exercise walking without symptoms. She says when she runs she might get a rapid heart rate.   However, this is probably in keeping with her level of activity. She denies any resting palpitations, presyncope or syncope. She does not have chest pressure, neck or arm discomfort. He does not have significant shortness of breath, PND or orthopnea. She has no weight gain or edema.  Not on File  Current Outpatient Prescriptions  Medication Sig Dispense Refill  . amitriptyline (ELAVIL) 25 MG tablet Take 25 mg by mouth at bedtime. For sleep     . norgestimate-ethinyl estradiol (SPRINTEC 28) 0.25-35 MG-MCG per tablet Take 1 tablet by mouth daily. pls label in spanish-disp 3 packs at a time     . SUMAtriptan (IMITREX) 50 MG tablet NO INSTRUCTIONS GIVEN      No current facility-administered medications for this visit.    Past Medical History  Diagnosis Date  . Heart murmur     History reviewed. No pertinent past surgical history.  No family history on file.  History   Social History  . Marital Status: Married    Spouse Name: N/A    Number of Children: N/A  . Years of Education: N/A   Occupational History  . Not on file.   Social History Main Topics  . Smoking status: Never Smoker   . Smokeless tobacco: Not on file  . Alcohol Use: Not on file  . Drug Use: Not on file  . Sexual Activity: Not on file   Other Topics Concern  . Not on file   Social History Narrative    ROS:  As stated in the HPI and negative for all other systems.  PHYSICAL EXAM BP 118/70 mmHg  Pulse 57  Ht 5' (1.524 m)  Wt 167 lb (75.751 kg)  BMI 32.62 kg/m2  GENERAL:  Well appearing HEENT:  Pupils equal round and reactive, fundi not visualized, oral mucosa unremarkable NECK:  No jugular venous distention, waveform within normal limits,  carotid upstroke brisk and symmetric, no bruits, no thyromegaly LYMPHATICS:  No cervical, inguinal adenopathy LUNGS:  Clear to auscultation bilaterally BACK:  No CVA tenderness CHEST:  Unremarkable HEART:  PMI not displaced or sustained,S1 and S2 within normal limits, no S3, no S4, no clicks, no rubs, brief 2/6 left mid sternal border systolic murmur nonradiating, no diastolic murmurs ABD:  Flat, positive bowel sounds normal in frequency in pitch, no bruits, no rebound, no guarding, no midline pulsatile mass, no hepatomegaly, no splenomegaly EXT:  2 plus pulses throughout, no edema, no cyanosis no clubbing SKIN:  No rashes no nodules NEURO:  Cranial nerves II through XII grossly intact, motor grossly intact throughout PSYCH:  Cognitively intact, oriented to person place and time   EKG:  Sinus rhythm, rate 57, axis within normal limits, intervals within normal limits, no acute ST-T wave changes.  12/10/2013   ASSESSMENT AND PLAN   MURMUR:   The patient has a soft early peaking systolic murmur that may represent a flow murmur. However, it could be mild anteriorly directed MR. I will order an echocardiogram. Further evaluation will be based on this result.

## 2013-12-10 NOTE — Patient Instructions (Signed)
Your physician recommends that you schedule a follow-up appointment in: AS NEEDED PENDING TEST RESULTS  Your physician has requested that you have an echocardiogram. Echocardiography is a painless test that uses sound waves to create images of your heart. It provides your doctor with information about the size and shape of your heart and how well your heart's chambers and valves are working. This procedure takes approximately one hour. There are no restrictions for this procedure.    

## 2013-12-18 ENCOUNTER — Ambulatory Visit (HOSPITAL_COMMUNITY)
Admission: RE | Admit: 2013-12-18 | Discharge: 2013-12-18 | Disposition: A | Discharge status: Home / Self Care | Payer: Self-pay | Source: Ambulatory Visit | Attending: Cardiovascular Disease | Admitting: Cardiovascular Disease

## 2013-12-18 DIAGNOSIS — R011 Cardiac murmur, unspecified: Secondary | ICD-10-CM

## 2013-12-18 NOTE — Progress Notes (Signed)
2D Echo Performed 12/18/2013    Indya Oliveria, RCS
